# Patient Record
Sex: Female | Born: 1980 | Race: White | Hispanic: No | Marital: Married | Smoking: Current every day smoker
Health system: Southern US, Community
[De-identification: ages and names within clinical notes are randomized; demographics above are authoritative.]

## PROBLEM LIST (undated history)

## (undated) DIAGNOSIS — I10 Essential (primary) hypertension: Secondary | ICD-10-CM

## (undated) DIAGNOSIS — M5136 Other intervertebral disc degeneration, lumbar region: Secondary | ICD-10-CM

## (undated) DIAGNOSIS — K219 Gastro-esophageal reflux disease without esophagitis: Secondary | ICD-10-CM

## (undated) HISTORY — PX: CARPAL TUNNEL RELEASE: SHX101

---

## 2016-04-07 ENCOUNTER — Emergency Department (HOSPITAL_COMMUNITY): Payer: Self-pay

## 2016-04-07 ENCOUNTER — Emergency Department (HOSPITAL_COMMUNITY)
Admission: EM | Admit: 2016-04-07 | Discharge: 2016-04-07 | Disposition: A | Discharge status: Home / Self Care | Payer: Self-pay | Attending: Emergency Medicine | Admitting: Emergency Medicine

## 2016-04-07 ENCOUNTER — Encounter (HOSPITAL_COMMUNITY): Payer: Self-pay | Admitting: *Deleted

## 2016-04-07 DIAGNOSIS — F17210 Nicotine dependence, cigarettes, uncomplicated: Secondary | ICD-10-CM | POA: Insufficient documentation

## 2016-04-07 DIAGNOSIS — I10 Essential (primary) hypertension: Secondary | ICD-10-CM | POA: Insufficient documentation

## 2016-04-07 DIAGNOSIS — J9801 Acute bronchospasm: Secondary | ICD-10-CM | POA: Insufficient documentation

## 2016-04-07 HISTORY — DX: Gastro-esophageal reflux disease without esophagitis: K21.9

## 2016-04-07 HISTORY — DX: Essential (primary) hypertension: I10

## 2016-04-07 HISTORY — DX: Other intervertebral disc degeneration, lumbar region: M51.36

## 2016-04-07 MED ORDER — METHYLPREDNISOLONE SODIUM SUCC 125 MG IJ SOLR
125.0000 mg | Freq: Once | INTRAMUSCULAR | Status: AC
  Administered 2016-04-07: 125 mg via INTRAMUSCULAR
  Filled 2016-04-07: qty 2

## 2016-04-07 MED ORDER — CEPHALEXIN 500 MG PO CAPS: 500.0000 mg | ORAL_CAPSULE | Freq: Four times a day (QID) | ORAL | 0 refills | Status: AC

## 2016-04-07 MED ORDER — ALBUTEROL SULFATE HFA 108 (90 BASE) MCG/ACT IN AERS
2.0000 | INHALATION_SPRAY | RESPIRATORY_TRACT | Status: DC | PRN
  Administered 2016-04-07: 2 via RESPIRATORY_TRACT
  Filled 2016-04-07: qty 6.7

## 2016-04-07 MED ORDER — TRAMADOL HCL 50 MG PO TABS: 50.0000 mg | ORAL_TABLET | Freq: Four times a day (QID) | ORAL | 0 refills | Status: AC | PRN

## 2016-04-07 NOTE — ED Notes (Signed)
Pt reports that she smokes a pack daily of cigarettes Was seen at [facility] dx'd with sinusitis and given antibiotics Completed course and still feels badly  Also complains of fall with L shoulder pain unrelieved by ibuprofen  She is clear voiced, speaks in complete sentences and appears ill  Education: viral illnesses as well as smoking antibiotic connection

## 2016-04-07 NOTE — ED Triage Notes (Signed)
Pt reports cough, congestion, headache, chest hurting when she coughs or takes a deep breath, and fever (pt has not checked her temp). Pt states she was treated with [facility] but pt states the medicine didn't work and she is still sick.  Pt also c/o left shoulder pain from a recent fall.

## 2016-04-07 NOTE — Discharge Instructions (Signed)
Drink plenty of fluids. Use the inhaler every 4-6 hours as needed for wheezing or cough. Tylenol for any fever or chills and follow-up as needed

## 2016-04-07 NOTE — ED Provider Notes (Signed)
[facility]-EM[facility]ENCY DEPT Provider Note   CSN: [id number] Arrival date & time: 04/07/16  1907     History   Chief Complaint Chief Complaint  Patient presents with  . Nasal Congestion  . Generalized Body Aches    HPI Jordan Holland is a 35 y.o. female.  Patient complains of cough congestion wheezing. Patient has history of smoking. Patient recently finished doxycycline    Cough  This is a new problem. The current episode started more than 2 days ago. The problem occurs constantly. The problem has not changed since onset.The cough is productive of purulent sputum. There has been no fever. Associated symptoms include wheezing. Pertinent negatives include no chest pain and no headaches. Treatments tried: Doxycycline. The treatment provided no relief. She is a smoker. Her past medical history does not include pneumonia.    Past Medical History:  Diagnosis Date  . DDD (degenerative disc disease), lumbar   . GERD (gastroesophageal reflux disease)   . Hypertension     There are no active problems to display for this patient.   Past Surgical History:  Procedure Laterality Date  . CARPAL TUNNEL RELEASE      OB History    No data available       Home Medications    Prior to Admission medications   Medication Sig Start Date End Date Taking? Authorizing Provider  cephALEXin (KEFLEX) 500 MG capsule Take 1 capsule (500 mg total) by mouth 4 (four) times daily. 04/07/16   Bethann BerkshireJoseph Daiana Vitiello, MD  traMADol (ULTRAM) 50 MG tablet Take 1 tablet (50 mg total) by mouth every 6 (six) hours as needed. 04/07/16   Bethann BerkshireJoseph Ewelina Naves, MD    Family History History reviewed. No pertinent family history.  Social History Social History  Substance Use Topics  . Smoking status: Current Every Day Smoker    Packs/day: 1.00    Types: Cigarettes  . Smokeless tobacco: Never Used  . Alcohol use No     Allergies   Biaxin [clarithromycin]; Erythromycin; and Penicillins   Review of Systems Review of  Systems  Constitutional: Negative for appetite change and fatigue.  HENT: Negative for congestion, ear discharge and sinus pressure.   Eyes: Negative for discharge.  Respiratory: Positive for cough and wheezing.   Cardiovascular: Negative for chest pain.  Gastrointestinal: Negative for abdominal pain and diarrhea.  Genitourinary: Negative for frequency and hematuria.  Musculoskeletal: Negative for back pain.  Skin: Negative for rash.  Neurological: Negative for seizures and headaches.  Psychiatric/Behavioral: Negative for hallucinations.     Physical Exam Updated Vital Signs BP 132/76 (BP Location: Left Arm)   Pulse 110   Temp 97.9 F (36.6 C) (Oral)   Resp 16   Ht 5\' 6"  (1.676 m)   Wt 177 lb 5 oz (80.4 kg)   LMP 03/27/2016   SpO2 99%   BMI 28.62 kg/m   Physical Exam  Constitutional: She is oriented to person, place, and time. She appears well-developed.  HENT:  Head: Normocephalic.  Eyes: Conjunctivae and EOM are normal. No scleral icterus.  Neck: Neck supple. No thyromegaly present.  Cardiovascular: Normal rate and regular rhythm.  Exam reveals no gallop and no friction rub.   No murmur heard. Pulmonary/Chest: No stridor. She has wheezes. She has no rales. She exhibits no tenderness.  Abdominal: She exhibits no distension. There is no tenderness. There is no rebound.  Musculoskeletal: Normal range of motion. She exhibits no edema.  Lymphadenopathy:    She has no cervical adenopathy.  Neurological:  She is oriented to person, place, and time. She exhibits normal muscle tone. Coordination normal.  Skin: No rash noted. No erythema.  Psychiatric: She has a normal mood and affect. Her behavior is normal.     ED Treatments / Results  Labs (all labs ordered are listed, but only abnormal results are displayed) Labs Reviewed - No data to display  EKG  EKG Interpretation None       Radiology Dg Chest 2 View  Result Date: 04/07/2016 CLINICAL DATA:  Cough,  congestion EXAM: CHEST  2 VIEW COMPARISON:  None. FINDINGS: The heart size and mediastinal contours are within normal limits. Both lungs are clear. The visualized skeletal structures are unremarkable. IMPRESSION: No active cardiopulmonary disease. Electronically Signed   By: Tollie Ethavid  Kwon M.D.   On: 04/07/2016 19:50    Procedures Procedures (including critical care time)  Medications Ordered in ED Medications  methylPREDNISolone sodium succinate (SOLU-MEDROL) 125 mg/2 mL injection 125 mg (not administered)  albuterol (PROVENTIL HFA;VENTOLIN HFA) 108 (90 Base) MCG/ACT inhaler 2 puff (not administered)     Initial Impression / Assessment and Plan / ED Course  I have reviewed the triage vital signs and the nursing notes.  Pertinent labs & imaging results that were available during my care of the patient were reviewed by me and considered in my medical decision making (see chart for details).  Clinical Course     Patient with bronchitis and bronchospasm. She will be given albuterol inhaler. Also given Keflex and injection of Solu-Medrol  Final Clinical Impressions(s) / ED Diagnoses   Final diagnoses:  Bronchospasm    New Prescriptions New Prescriptions   CEPHALEXIN (KEFLEX) 500 MG CAPSULE    Take 1 capsule (500 mg total) by mouth 4 (four) times daily.   TRAMADOL (ULTRAM) 50 MG TABLET    Take 1 tablet (50 mg total) by mouth every 6 (six) hours as needed.     Bethann BerkshireJoseph Claiborne Stroble, MD 04/07/16 2107

## 2018-03-15 IMAGING — DX DG CHEST 2V
2 series · 2 of 2 positions shown · non-contrast
Comparison: None.

CLINICAL DATA: Cough, congestion

EXAM:
CHEST  2 VIEW

[chest pa]
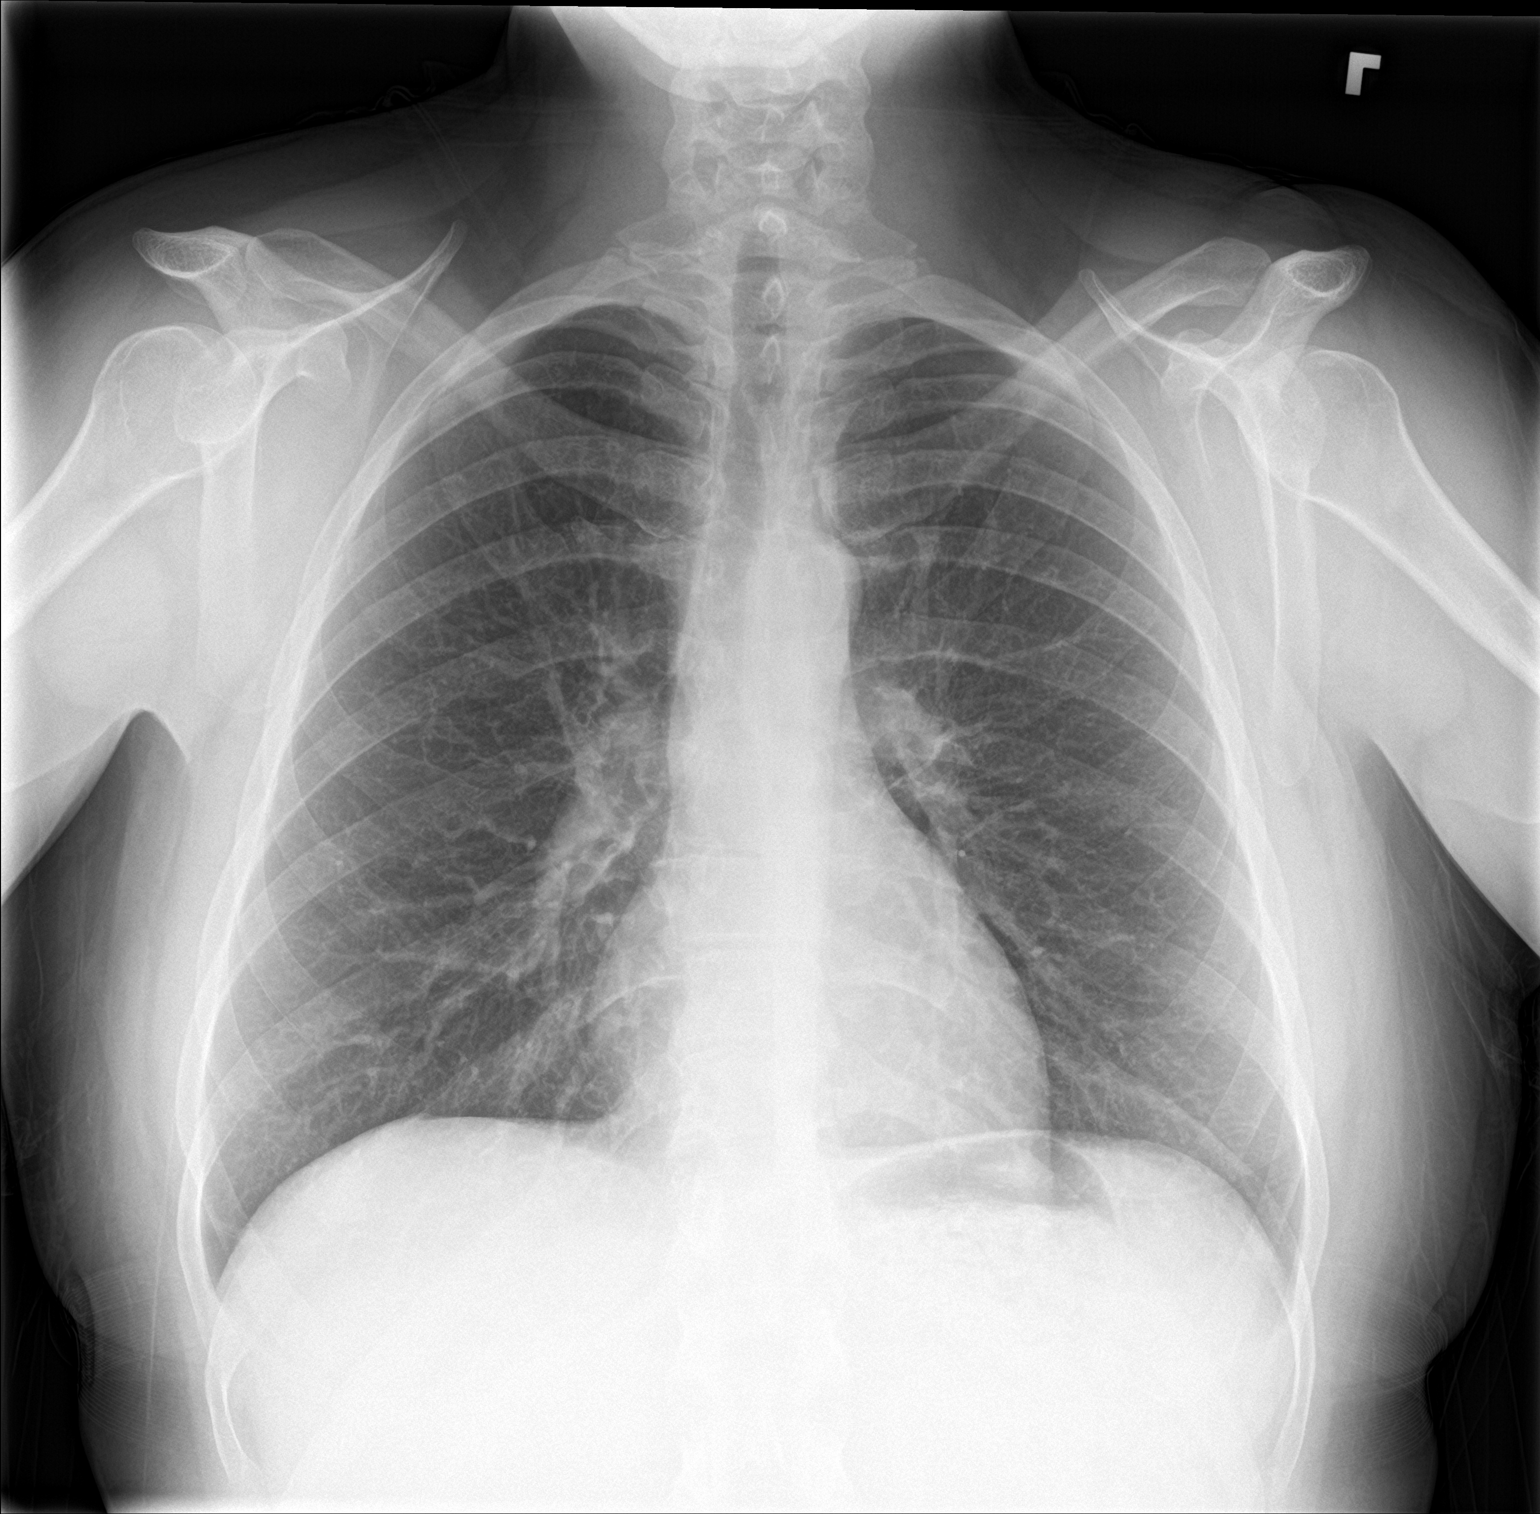

[chest lat]
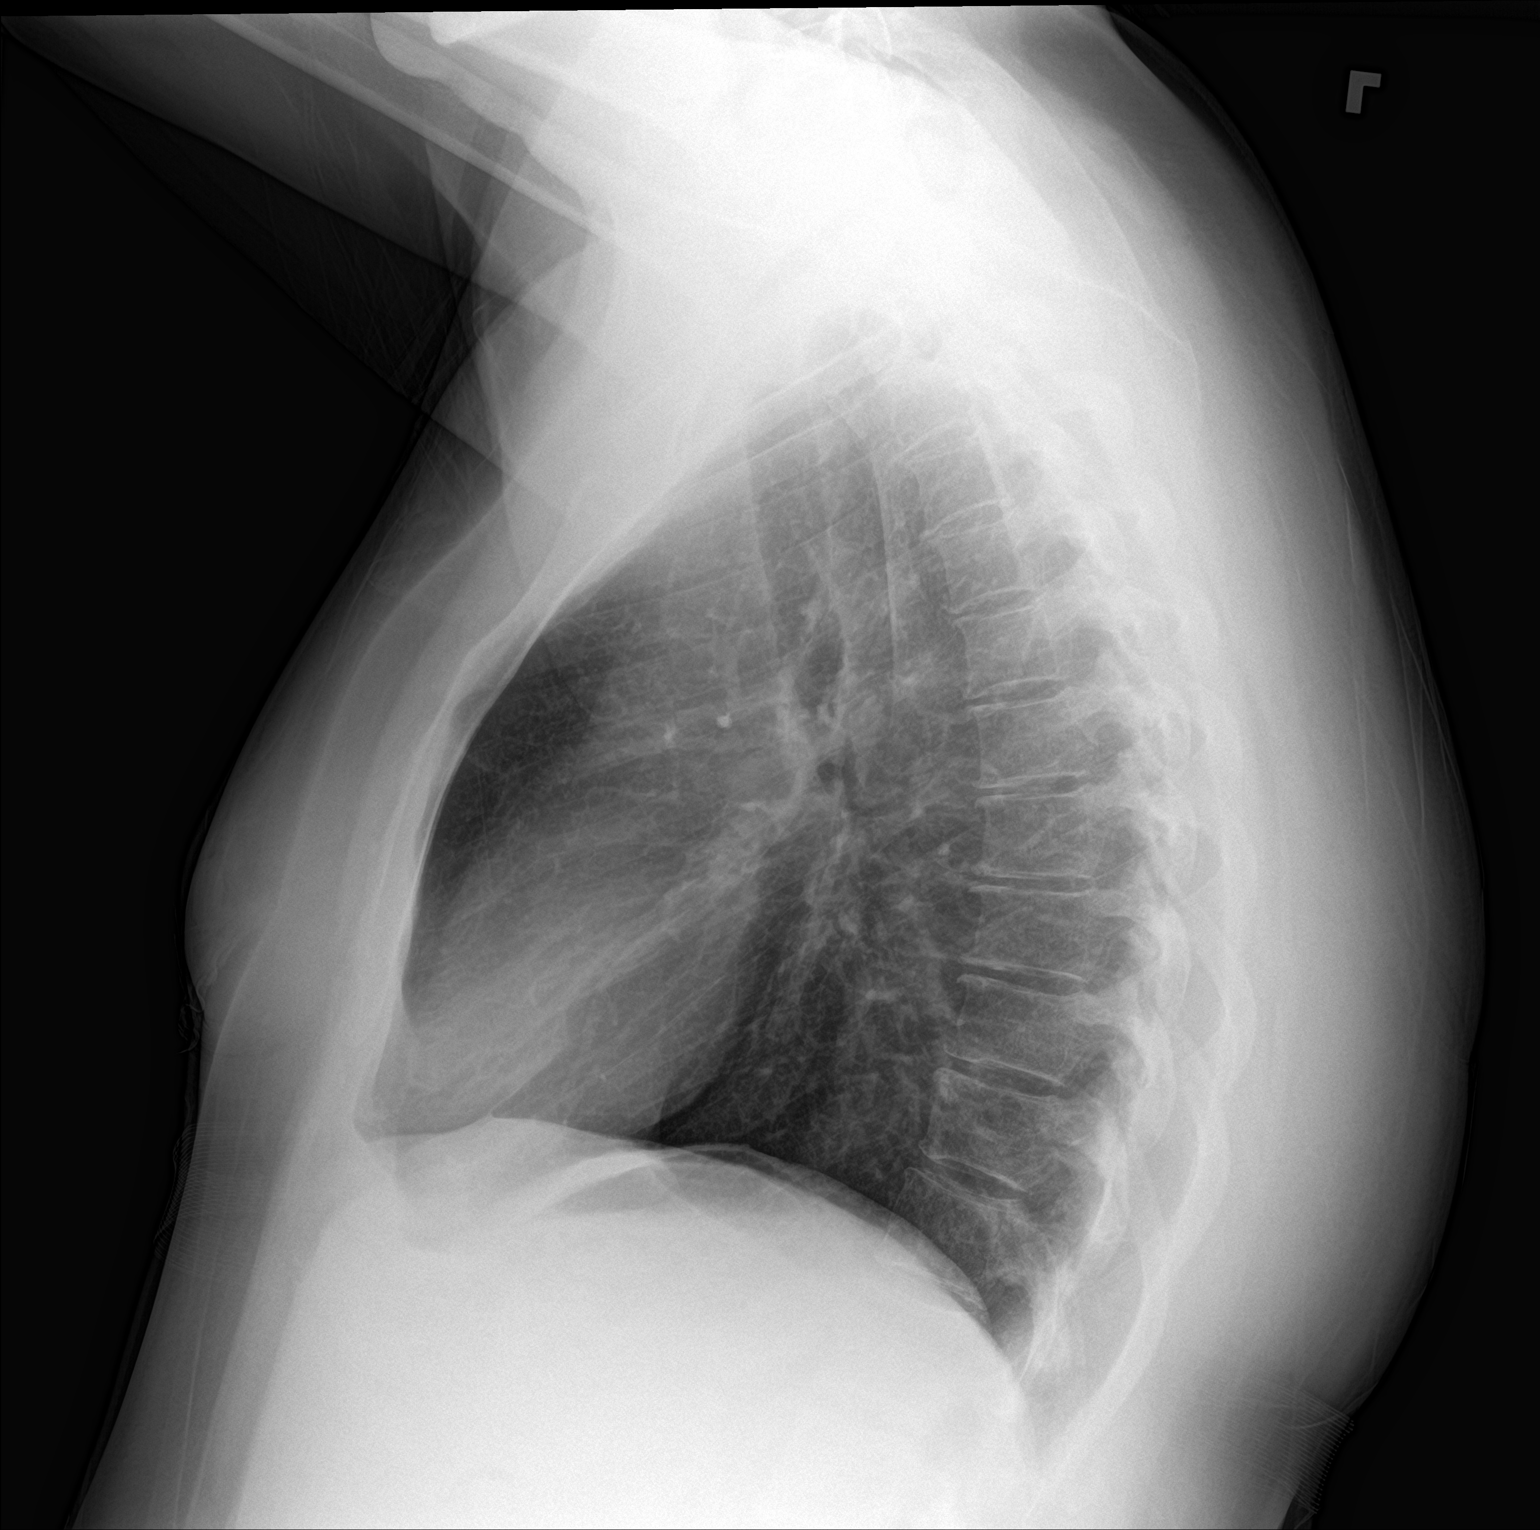

[2 of 2 positions shown; findings below may reference images not displayed]

FINDINGS: The heart size and mediastinal contours are within normal limits.
Both lungs are clear. The visualized skeletal structures are
unremarkable.
IMPRESSION: No active cardiopulmonary disease.
